# Patient Record
Sex: Female | Born: 1975 | Race: Black or African American | Hispanic: No | Marital: Married | Smoking: Never smoker
Health system: Southern US, Community
[De-identification: ages and names within clinical notes are randomized; demographics above are authoritative.]

## PROBLEM LIST (undated history)

## (undated) DIAGNOSIS — M419 Scoliosis, unspecified: Secondary | ICD-10-CM

## (undated) DIAGNOSIS — M549 Dorsalgia, unspecified: Secondary | ICD-10-CM

## (undated) DIAGNOSIS — G43909 Migraine, unspecified, not intractable, without status migrainosus: Secondary | ICD-10-CM

## (undated) HISTORY — PX: ENDOMETRIAL ABLATION: SHX621

## (undated) HISTORY — PX: BACK SURGERY: SHX140

## (undated) HISTORY — PX: ABDOMINAL HYSTERECTOMY: SHX81

## (undated) HISTORY — PX: TUBAL LIGATION: SHX77

## (undated) HISTORY — PX: CHOLECYSTECTOMY: SHX55

---

## 2000-03-09 ENCOUNTER — Ambulatory Visit (HOSPITAL_COMMUNITY): Admission: RE | Admit: 2000-03-09 | Discharge: 2000-03-09 | Payer: Self-pay | Admitting: Family Medicine

## 2000-03-09 ENCOUNTER — Encounter: Payer: Self-pay | Admitting: Family Medicine

## 2000-12-21 ENCOUNTER — Encounter: Admission: RE | Admit: 2000-12-21 | Discharge: 2000-12-21 | Payer: Self-pay

## 2013-07-06 ENCOUNTER — Emergency Department (HOSPITAL_BASED_OUTPATIENT_CLINIC_OR_DEPARTMENT_OTHER): Payer: BC Managed Care – PPO

## 2013-07-06 ENCOUNTER — Emergency Department (HOSPITAL_BASED_OUTPATIENT_CLINIC_OR_DEPARTMENT_OTHER)
Admission: EM | Admit: 2013-07-06 | Discharge: 2013-07-06 | Disposition: A | Discharge status: Home / Self Care | Payer: BC Managed Care – PPO | Attending: Emergency Medicine | Admitting: Emergency Medicine

## 2013-07-06 ENCOUNTER — Encounter (HOSPITAL_BASED_OUTPATIENT_CLINIC_OR_DEPARTMENT_OTHER): Payer: Self-pay | Admitting: Emergency Medicine

## 2013-07-06 DIAGNOSIS — R1032 Left lower quadrant pain: Secondary | ICD-10-CM | POA: Insufficient documentation

## 2013-07-06 DIAGNOSIS — Z8739 Personal history of other diseases of the musculoskeletal system and connective tissue: Secondary | ICD-10-CM | POA: Insufficient documentation

## 2013-07-06 DIAGNOSIS — Z79899 Other long term (current) drug therapy: Secondary | ICD-10-CM | POA: Insufficient documentation

## 2013-07-06 DIAGNOSIS — R110 Nausea: Secondary | ICD-10-CM | POA: Insufficient documentation

## 2013-07-06 DIAGNOSIS — R630 Anorexia: Secondary | ICD-10-CM | POA: Insufficient documentation

## 2013-07-06 DIAGNOSIS — Z3202 Encounter for pregnancy test, result negative: Secondary | ICD-10-CM | POA: Insufficient documentation

## 2013-07-06 DIAGNOSIS — Z8679 Personal history of other diseases of the circulatory system: Secondary | ICD-10-CM | POA: Insufficient documentation

## 2013-07-06 DIAGNOSIS — R197 Diarrhea, unspecified: Secondary | ICD-10-CM | POA: Insufficient documentation

## 2013-07-06 HISTORY — DX: Dorsalgia, unspecified: M54.9

## 2013-07-06 HISTORY — DX: Scoliosis, unspecified: M41.9

## 2013-07-06 HISTORY — DX: Migraine, unspecified, not intractable, without status migrainosus: G43.909

## 2013-07-06 LAB — URINE MICROSCOPIC-ADD ON

## 2013-07-06 LAB — COMPREHENSIVE METABOLIC PANEL
ALK PHOS: 69 U/L (ref 39–117)
ALT: 18 U/L (ref 0–35)
ANION GAP: 13 (ref 5–15)
AST: 24 U/L (ref 0–37)
Albumin: 4.2 g/dL (ref 3.5–5.2)
BUN: 7 mg/dL (ref 6–23)
CO2: 25 mEq/L (ref 19–32)
CREATININE: 0.8 mg/dL (ref 0.50–1.10)
Calcium: 9.5 mg/dL (ref 8.4–10.5)
Chloride: 105 mEq/L (ref 96–112)
GFR calc non Af Amer: >90 mL/min (ref >90)
GLUCOSE: 91 mg/dL (ref 70–99)
Potassium: 4.2 mEq/L (ref 3.7–5.3)
Sodium: 143 mEq/L (ref 137–147)
TOTAL PROTEIN: 7.5 g/dL (ref 6.0–8.3)
Total Bilirubin: 0.7 mg/dL (ref 0.3–1.2)

## 2013-07-06 LAB — PREGNANCY, URINE: Preg Test, Ur: NEGATIVE

## 2013-07-06 LAB — URINALYSIS, ROUTINE W REFLEX MICROSCOPIC
Bilirubin Urine: NEGATIVE
Glucose, UA: NEGATIVE mg/dL
Hgb urine dipstick: NEGATIVE
Ketones, ur: 15 mg/dL — AB
Nitrite: NEGATIVE
Protein, ur: NEGATIVE mg/dL
Specific Gravity, Urine: 1.024 (ref 1.005–1.030)
Urobilinogen, UA: 1 mg/dL (ref 0.0–1.0)
pH: 6.5 (ref 5.0–8.0)

## 2013-07-06 MED ORDER — HYDROCODONE-ACETAMINOPHEN 5-325 MG PO TABS: 1.0000 | ORAL_TABLET | Freq: Four times a day (QID) | ORAL | Status: DC | PRN

## 2013-07-06 MED ORDER — ONDANSETRON HCL 4 MG PO TABS: 4.0000 mg | ORAL_TABLET | Freq: Four times a day (QID) | ORAL | Status: DC

## 2013-07-06 MED ORDER — SODIUM CHLORIDE 0.9 % IV BOLUS (SEPSIS)
1000.0000 mL | Freq: Once | INTRAVENOUS | Status: AC
  Administered 2013-07-06: 1000 mL via INTRAVENOUS

## 2013-07-06 MED ORDER — MORPHINE SULFATE 4 MG/ML IJ SOLN
4.0000 mg | Freq: Once | INTRAMUSCULAR | Status: AC
  Administered 2013-07-06: 4 mg via INTRAVENOUS
  Filled 2013-07-06: qty 1

## 2013-07-06 MED ORDER — IOHEXOL 300 MG/ML  SOLN
100.0000 mL | Freq: Once | INTRAMUSCULAR | Status: AC | PRN
  Administered 2013-07-06: 100 mL via INTRAVENOUS

## 2013-07-06 MED ORDER — IOHEXOL 300 MG/ML  SOLN
50.0000 mL | Freq: Once | INTRAMUSCULAR | Status: AC | PRN
  Administered 2013-07-06: 50 mL via ORAL

## 2013-07-06 MED ORDER — ONDANSETRON HCL 4 MG/2ML IJ SOLN
4.0000 mg | Freq: Once | INTRAMUSCULAR | Status: AC
  Administered 2013-07-06: 4 mg via INTRAVENOUS
  Filled 2013-07-06: qty 2

## 2013-07-06 MED ORDER — LOPERAMIDE HCL 2 MG PO CAPS
4.0000 mg | ORAL_CAPSULE | Freq: Once | ORAL | Status: AC
  Administered 2013-07-06: 4 mg via ORAL
  Filled 2013-07-06: qty 2

## 2013-07-06 NOTE — ED Provider Notes (Signed)
CSN: [id number]     Arrival date & time 07/06/13  1129 History   First MD Initiated Contact with Patient 07/06/13 1144     Chief Complaint  Patient presents with  . Abdominal Pain     (Consider location/radiation/quality/duration/timing/severity/associated sxs/prior Treatment) Patient is a 38 y.o. female presenting with abdominal pain. The history is provided by the patient.  Abdominal Pain Pain location:  LLQ Pain quality: sharp, shooting, stabbing and throbbing   Pain radiates to:  Does not radiate Pain severity:  Moderate Onset quality:  Gradual Duration:  3 days Timing:  Constant Progression:  Worsening Chronicity:  Recurrent (pt states for months she will have 1 days of abd pain and diarrhea but always resolves on its own within 12-24 hours until now) Context: eating   Context: not alcohol use, not previous surgeries, not recent travel, not sick contacts, not suspicious food intake and not trauma   Relieved by:  Nothing Worsened by:  Eating Ineffective treatments:  None tried Associated symptoms: anorexia, diarrhea and nausea   Associated symptoms: no chills, no constipation, no dysuria, no fever, no hematuria, no vaginal bleeding and no vomiting   Risk factors: has not had multiple surgeries, not pregnant and no recent hospitalization     Past Medical History  Diagnosis Date  . Back pain   . Scoliosis   . Migraine    No past surgical history on file. No family history on file. History  Substance Use Topics  . Smoking status: Never Smoker   . Smokeless tobacco: Not on file  . Alcohol Use: No   OB History   Grav Para Term Preterm Abortions TAB SAB Ect Mult Living                 Review of Systems  Constitutional: Negative for fever and chills.  Gastrointestinal: Positive for nausea, abdominal pain, diarrhea and anorexia. Negative for vomiting and constipation.  Genitourinary: Negative for dysuria, hematuria and vaginal bleeding.  All other systems reviewed and  are negative.     Allergies  Biaxin  Home Medications   Prior to Admission medications   Medication Sig Start Date End Date Taking? Authorizing Provider  carisoprodol (SOMA) 350 MG tablet Take 350 mg by mouth 4 (four) times daily as needed for muscle spasms.   Yes Historical Provider, MD  ibuprofen (ADVIL,MOTRIN) 800 MG tablet Take 800 mg by mouth every 8 (eight) hours as needed.   Yes Historical Provider, MD  Ondansetron HCl (ZOFRAN PO) Take by mouth.   Yes Historical Provider, MD   BP 156/94  Pulse 76  Temp(Src) 98.3 F (36.8 C) (Oral)  Resp 20  Ht 5\' 4"  (1.626 m)  Wt 142 lb (64.411 kg)  BMI 24.36 kg/m2  SpO2 100% Physical Exam  Nursing note and vitals reviewed. Constitutional: She is oriented to person, place, and time. She appears well-developed and well-nourished. No distress.  HENT:  Head: Normocephalic and atraumatic.  Eyes: EOM are normal. Pupils are equal, round, and reactive to light.  Cardiovascular: Normal rate, regular rhythm, normal heart sounds and intact distal pulses.  Exam reveals no friction rub.   No murmur heard. Pulmonary/Chest: Effort normal and breath sounds normal. She has no wheezes. She has no rales.  Abdominal: Soft. Bowel sounds are normal. She exhibits no distension. There is tenderness in the left lower quadrant. There is guarding. There is no rebound and no CVA tenderness.  Musculoskeletal: Normal range of motion. She exhibits no tenderness.  No edema  Neurological: She is alert and oriented to person, place, and time. No cranial nerve deficit.  Skin: Skin is warm and dry. No rash noted.  Psychiatric: She has a normal mood and affect. Her behavior is normal.    ED Course  Procedures (including critical care time) Labs Review Labs Reviewed  URINALYSIS, ROUTINE W REFLEX MICROSCOPIC - Abnormal; Notable for the following:    Ketones, ur 15 (*)    Leukocytes, UA TRACE (*)    All other components within normal limits  URINE MICROSCOPIC-ADD  ON - Abnormal; Notable for the following:    Squamous Epithelial / LPF FEW (*)    Bacteria, UA MANY (*)    All other components within normal limits  PREGNANCY, URINE  COMPREHENSIVE METABOLIC PANEL    Imaging Review Ct Abdomen Pelvis W Contrast  07/06/2013   CLINICAL DATA:  Abdominal pain and diarrhea  EXAM: CT ABDOMEN AND PELVIS WITH CONTRAST  TECHNIQUE: Multidetector CT imaging of the abdomen and pelvis was performed using the standard protocol following bolus administration of intravenous contrast.  CONTRAST:  100mL OMNIPAQUE IOHEXOL 300 MG/ML  SOLN  COMPARISON:  None.  FINDINGS: The lung bases are free of acute infiltrate or sizable effusion.  The liver, spleen, adrenal glands and pancreas are within normal limits. The gallbladder is partially distended and demonstrates a single calcified gallstone within. The kidneys are somewhat limited evaluation due to scatter artifact from spinal fixation rods. No acute abnormality is noted diffuse spinal fixation rods are noted in the lower thoracic and upper lumbar spine.  The appendix is air-filled and within normal limits. The bladder is partially distended. No pelvic mass lesion is seen. Mild pelvic varices are noted on the left. This could be related to the patient's underlying clinical history of left lower quadrant pain.  IMPRESSION: Cholelithiasis.  Normal-appearing appendix.  Mild left pelvic varices.  Chronic changes in the spine.   Electronically Signed   By: Alcide CleverMark  Lukens M.D.   On: 07/06/2013 13:32     EKG Interpretation None      MDM   Final diagnoses:  Left lower quadrant pain  Diarrhea    Pt with intermittent abdominal pain and diarrhea for months but usually only last one day presents today with left lower quadrant abdominal pain and diarrhea for the last 3 days which is worsening. Denies any vaginal symptoms and no history of ovarian cysts. Denies any urinary symptoms. Pain is worse with eating. No recent travel, bad food exposure  or antibiotic use.  Pt seen at PCP's office prior to coming with normal CBC.   Pt has guarding and significant tenderness in LLQ and strong family hx of colon cancer.  Pt can't relate sx to certain foods.  UA and UPT within normal limits. CMP pending. CT of abdomen and pelvis pending. Patient given IV fluids, nausea and pain medication.  2:10 PM Labs and CT neg for acute issues.  Low suspicion for ovarian or kidney pathology.  Pt has follow up with GI through her pCP.  Gwyneth SproutWhitney Avah Bashor, MD 07/06/13 [phone]

## 2013-07-06 NOTE — ED Notes (Signed)
Abdominal pain and diarrhea x 3 days. Hx of similar episodes but she has not been diagnosed with a cause.

## 2013-07-06 NOTE — Discharge Instructions (Signed)
Abdominal Pain, Women °Abdominal (stomach, pelvic, or belly) pain can be caused by many things. It is important to tell your doctor: °· The location of the pain. °· Does it come and go or is it present all the time? °· Are there things that start the pain (eating certain foods, exercise)? °· Are there other symptoms associated with the pain (fever, nausea, vomiting, diarrhea)? °All of this is helpful to know when trying to find the cause of the pain. °CAUSES  °· Stomach: virus or bacteria infection, or ulcer. °· Intestine: appendicitis (inflamed appendix), regional ileitis (Crohn's disease), ulcerative colitis (inflamed colon), irritable bowel syndrome, diverticulitis (inflamed diverticulum of the colon), or cancer of the stomach or intestine. °· Gallbladder disease or stones in the gallbladder. °· Kidney disease, kidney stones, or infection. °· Pancreas infection or cancer. °· Fibromyalgia (pain disorder). °· Diseases of the female organs: °¨ Uterus: fibroid (non-cancerous) tumors or infection. °¨ Fallopian tubes: infection or tubal pregnancy. °¨ Ovary: cysts or tumors. °¨ Pelvic adhesions (scar tissue). °¨ Endometriosis (uterus lining tissue growing in the pelvis and on the pelvic organs). °¨ Pelvic congestion syndrome (female organs filling up with blood just before the menstrual period). °¨ Pain with the menstrual period. °¨ Pain with ovulation (producing an egg). °¨ Pain with an IUD (intrauterine device, birth control) in the uterus. °¨ Cancer of the female organs. °· Functional pain (pain not caused by a disease, may improve without treatment). °· Psychological pain. °· Depression. °DIAGNOSIS  °Your doctor will decide the seriousness of your pain by doing an examination. °· Blood tests. °· X-rays. °· Ultrasound. °· CT scan (computed tomography, special type of X-ray). °· MRI (magnetic resonance imaging). °· Cultures, for infection. °· Barium enema (dye inserted in the large intestine, to better view it with  X-rays). °· Colonoscopy (looking in intestine with a lighted tube). °· Laparoscopy (minor surgery, looking in abdomen with a lighted tube). °· Major abdominal exploratory surgery (looking in abdomen with a large incision). °TREATMENT  °The treatment will depend on the cause of the pain.  °· Many cases can be observed and treated at home. °· Over-the-counter medicines recommended by your caregiver. °· Prescription medicine. °· Antibiotics, for infection. °· Birth control pills, for painful periods or for ovulation pain. °· Hormone treatment, for endometriosis. °· Nerve blocking injections. °· Physical therapy. °· Antidepressants. °· Counseling with a psychologist or psychiatrist. °· Minor or major surgery. °HOME CARE INSTRUCTIONS  °· Do not take laxatives, unless directed by your caregiver. °· Take over-the-counter pain medicine only if ordered by your caregiver. Do not take aspirin because it can cause an upset stomach or bleeding. °· Try a clear liquid diet (broth or water) as ordered by your caregiver. Slowly move to a bland diet, as tolerated, if the pain is related to the stomach or intestine. °· Have a thermometer and take your temperature several times a day, and record it. °· Bed rest and sleep, if it helps the pain. °· Avoid sexual intercourse, if it causes pain. °· Avoid stressful situations. °· Keep your follow-up appointments and tests, as your caregiver orders. °· If the pain does not go away with medicine or surgery, you may try: °¨ Acupuncture. °¨ Relaxation exercises (yoga, meditation). °¨ Group therapy. °¨ Counseling. °SEEK MEDICAL CARE IF:  °· You notice certain foods cause stomach pain. °· Your home care treatment is not helping your pain. °· You need stronger pain medicine. °· You want your IUD removed. °· You feel faint or   lightheaded. °· You develop nausea and vomiting. °· You develop a rash. °· You are having side effects or an allergy to your medicine. °SEEK IMMEDIATE MEDICAL CARE IF:  °· Your  pain does not go away or gets worse. °· You have a fever. °· Your pain is felt only in portions of the abdomen. The right side could possibly be appendicitis. The left lower portion of the abdomen could be colitis or diverticulitis. °· You are passing blood in your stools (bright red or black tarry stools, with or without vomiting). °· You have blood in your urine. °· You develop chills, with or without a fever. °· You pass out. °MAKE SURE YOU:  °· Understand these instructions. °· Will watch your condition. °· Will get help right away if you are not doing well or get worse. °Document Released: 10/18/2006 Document Revised: 03/15/2011 Document Reviewed: 11/07/2008 °[employer]® Patient Information ©2015 [employer]. This information is not intended to replace advice given to you by your health care provider. Make sure you discuss any questions you have with your health care provider. ° °

## 2015-07-17 IMAGING — CT CT ABD-PELV W/ CM
2 of 4 series · 17 of 46 positions shown, 19 images · IV contrast (APPLIED)
Comparison: None.

CLINICAL DATA: Abdominal pain and diarrhea

EXAM:
CT ABDOMEN AND PELVIS WITH CONTRAST
TECHNIQUE: Multidetector CT imaging of the abdomen and pelvis was performed
using the standard protocol following bolus administration of
intravenous contrast.
CONTRAST:  100mL OMNIPAQUE IOHEXOL 300 MG/ML  SOLN

[Series 3: abd/pelvis 5.0 b31f · axial · 0.74mm/px · z∈[-372,+8]mm · 14 of 84 slices shown, 16 images]
[im 4/84  soft-tissue]
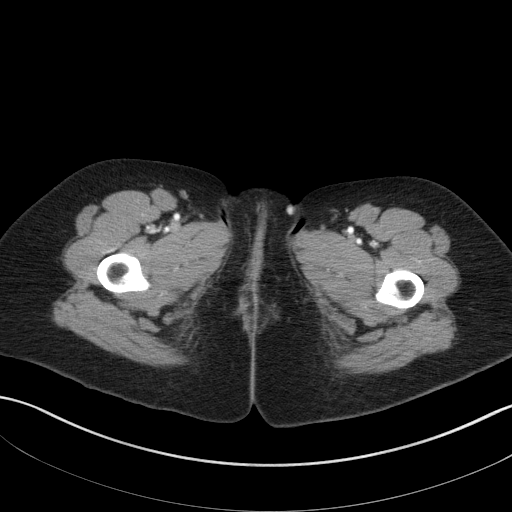
[im 4/84  bone]
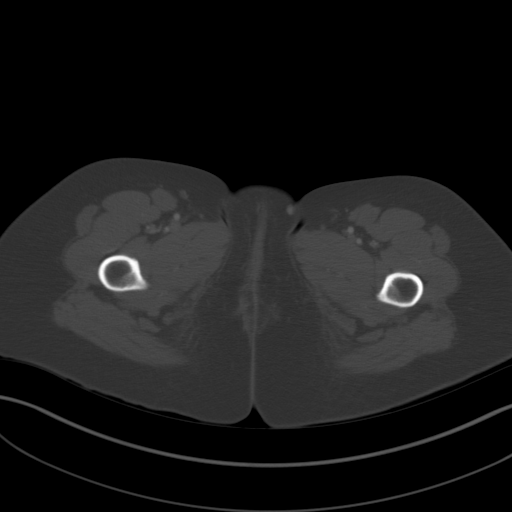
[im 10/84  soft-tissue]
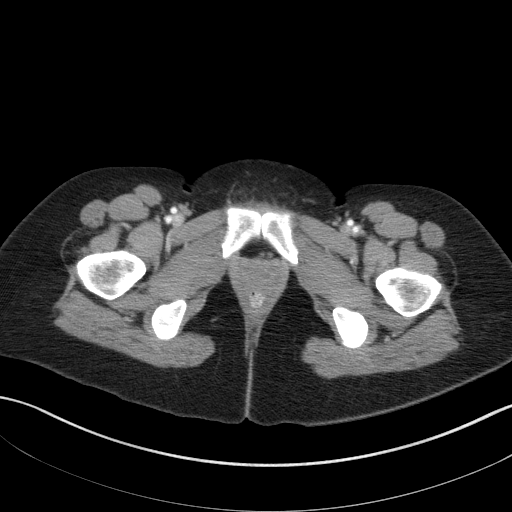
[im 17/84  soft-tissue]
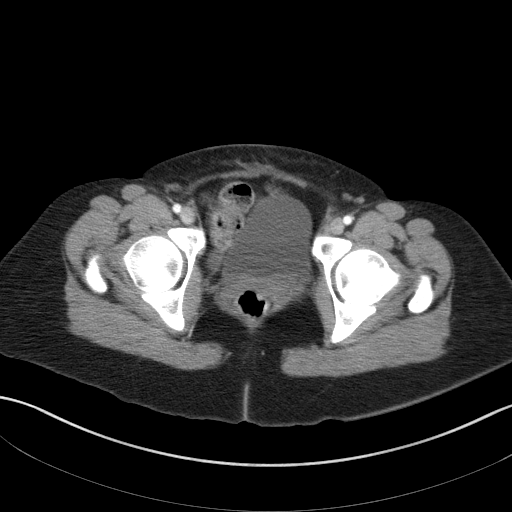
[im 24/84  soft-tissue]
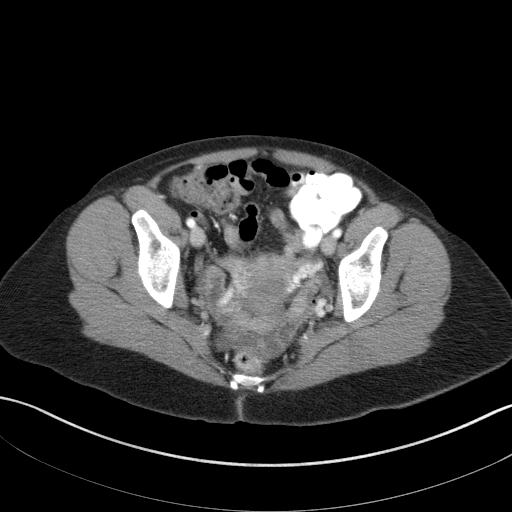
[im 27/84  soft-tissue]
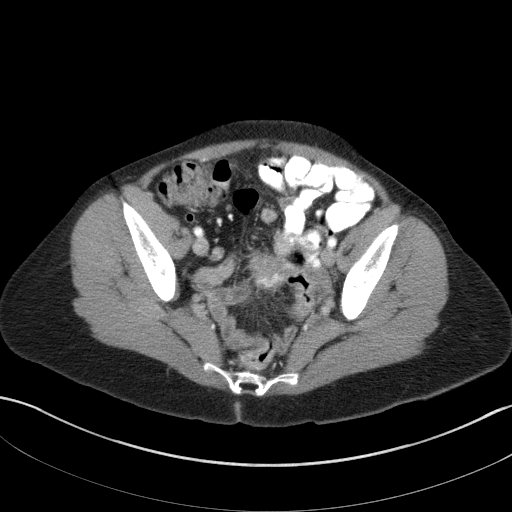
[im 34/84  soft-tissue]
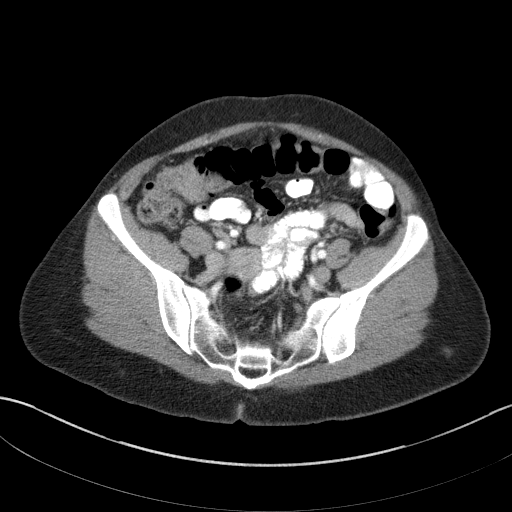
[im 40/84  soft-tissue]
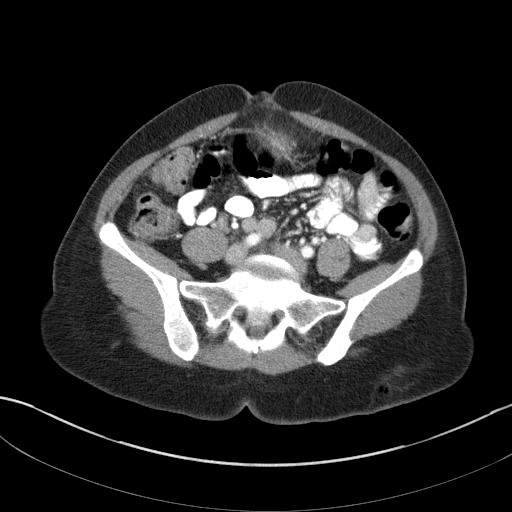
[im 44/84  soft-tissue]
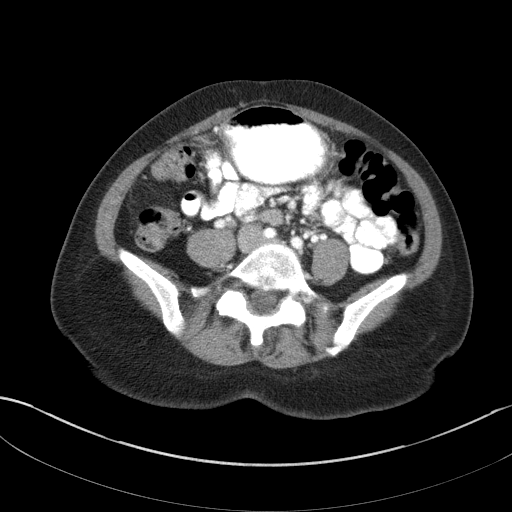
[im 50/84  soft-tissue]
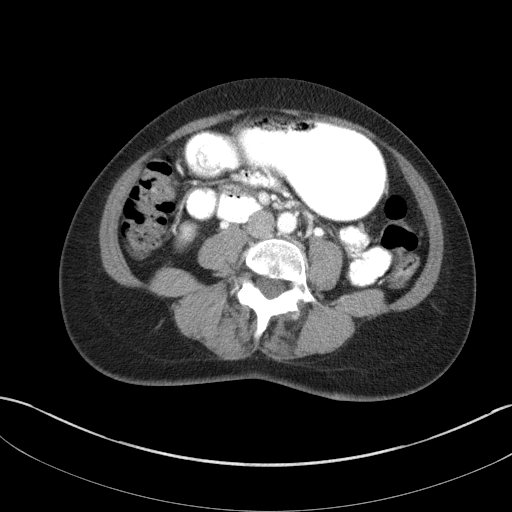
[im 50/84  bone]
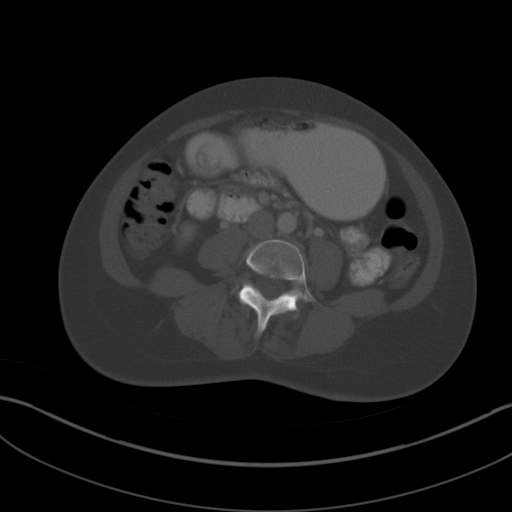
[im 57/84  soft-tissue]
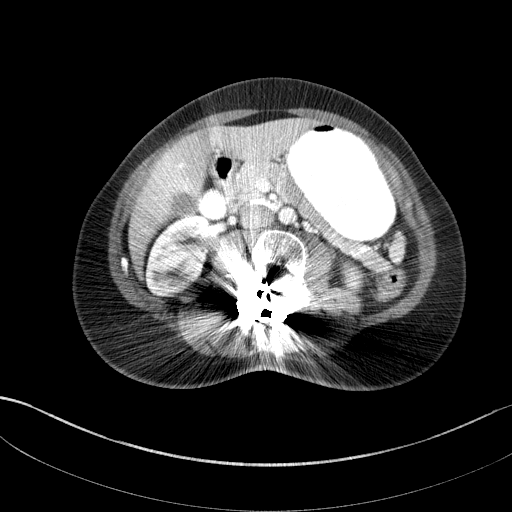
[im 64/84  soft-tissue]
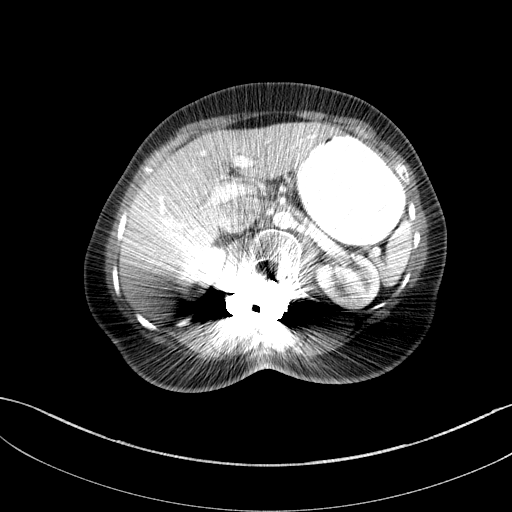
[im 67/84  soft-tissue]
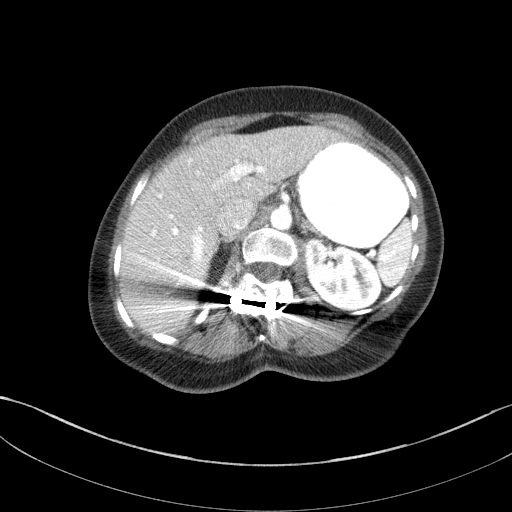
[im 74/84  soft-tissue]
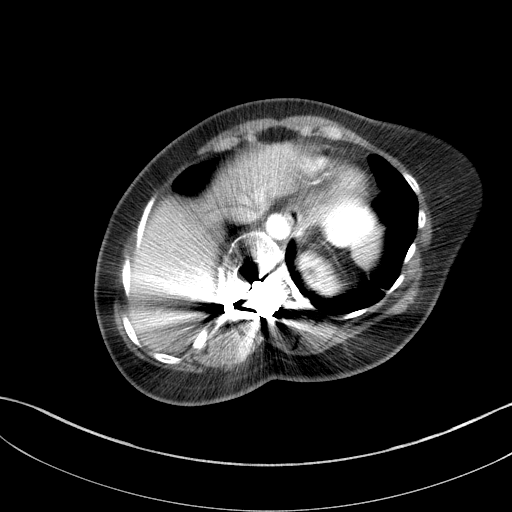
[im 80/84  soft-tissue]
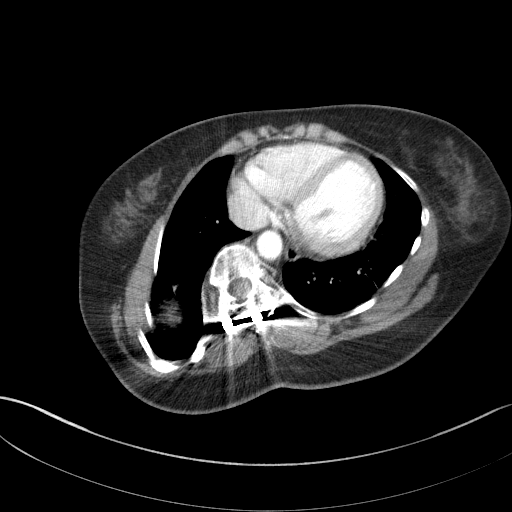

[Series 6: abd/pelvis 3.0 coronal · coronal · 0.89mm/px · 3 of 86 slices shown]
[im 29/86  soft-tissue]
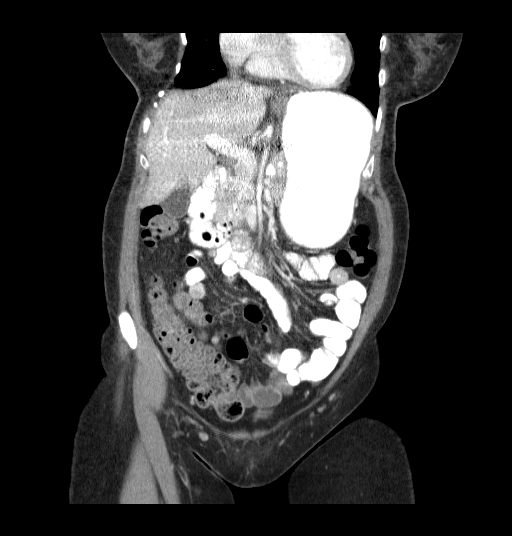
[im 38/86  soft-tissue]
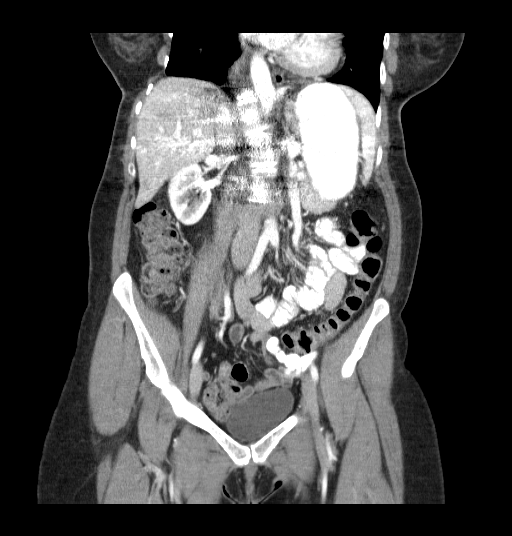
[im 48/86  soft-tissue]
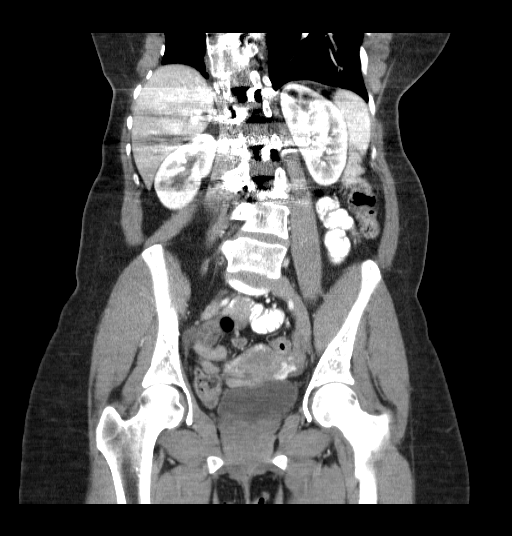

[17 of 46 positions shown; findings below may reference images not displayed]

FINDINGS: The lung bases are free of acute infiltrate or sizable effusion.

The liver, spleen, adrenal glands and pancreas are within normal
limits. The gallbladder is partially distended and demonstrates a
single calcified gallstone within. The kidneys are somewhat limited
evaluation due to scatter artifact from spinal fixation rods. No
acute abnormality is noted diffuse spinal fixation rods are noted in
the lower thoracic and upper lumbar spine.

The appendix is air-filled and within normal limits. The bladder is
partially distended. No pelvic mass lesion is seen. Mild pelvic
varices are noted on the left. This could be related to the
patient's underlying clinical history of left lower quadrant pain.
IMPRESSION: Cholelithiasis.

Normal-appearing appendix.

Mild left pelvic varices.

Chronic changes in the spine.

## 2020-03-15 ENCOUNTER — Emergency Department (HOSPITAL_BASED_OUTPATIENT_CLINIC_OR_DEPARTMENT_OTHER)
Admission: EM | Admit: 2020-03-15 | Discharge: 2020-03-15 | Disposition: A | Discharge status: Home / Self Care | Payer: BC Managed Care – PPO | Attending: Emergency Medicine | Admitting: Emergency Medicine

## 2020-03-15 ENCOUNTER — Encounter (HOSPITAL_BASED_OUTPATIENT_CLINIC_OR_DEPARTMENT_OTHER): Payer: Self-pay | Admitting: *Deleted

## 2020-03-15 ENCOUNTER — Other Ambulatory Visit: Payer: Self-pay

## 2020-03-15 DIAGNOSIS — R519 Headache, unspecified: Secondary | ICD-10-CM | POA: Diagnosis not present

## 2020-03-15 DIAGNOSIS — M25511 Pain in right shoulder: Secondary | ICD-10-CM | POA: Diagnosis present

## 2020-03-15 DIAGNOSIS — Y92481 Parking lot as the place of occurrence of the external cause: Secondary | ICD-10-CM | POA: Diagnosis not present

## 2020-03-15 DIAGNOSIS — R110 Nausea: Secondary | ICD-10-CM | POA: Diagnosis not present

## 2020-03-15 DIAGNOSIS — R0789 Other chest pain: Secondary | ICD-10-CM | POA: Insufficient documentation
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
## 2020-03-15 NOTE — Discharge Instructions (Signed)
Please come back and see [place] if your symptoms change or worsen.   Please take Tylenol/Ibuprofen together as needed 3 times daily for body aches, shoulder pain.   Please be sure to follow up with your primary care provider within the next week or 2 as needed.
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
## 2020-03-25 ENCOUNTER — Other Ambulatory Visit: Payer: Self-pay

## 2020-03-25 ENCOUNTER — Encounter (HOSPITAL_BASED_OUTPATIENT_CLINIC_OR_DEPARTMENT_OTHER): Payer: Self-pay | Admitting: *Deleted

## 2020-03-25 ENCOUNTER — Emergency Department (HOSPITAL_BASED_OUTPATIENT_CLINIC_OR_DEPARTMENT_OTHER): Payer: BC Managed Care – PPO

## 2020-03-25 ENCOUNTER — Emergency Department (HOSPITAL_BASED_OUTPATIENT_CLINIC_OR_DEPARTMENT_OTHER)
Admission: EM | Admit: 2020-03-25 | Discharge: 2020-03-25 | Disposition: A | Discharge status: Home / Self Care | Payer: BC Managed Care – PPO | Attending: Emergency Medicine | Admitting: Emergency Medicine

## 2020-03-25 DIAGNOSIS — E041 Nontoxic single thyroid nodule: Secondary | ICD-10-CM | POA: Diagnosis not present

## 2020-03-25 DIAGNOSIS — R519 Headache, unspecified: Secondary | ICD-10-CM | POA: Diagnosis not present

## 2020-03-25 DIAGNOSIS — M542 Cervicalgia: Secondary | ICD-10-CM | POA: Diagnosis present

## 2020-03-25 DIAGNOSIS — R110 Nausea: Secondary | ICD-10-CM | POA: Diagnosis not present

## 2020-03-25 DIAGNOSIS — H538 Other visual disturbances: Secondary | ICD-10-CM | POA: Insufficient documentation

## 2020-03-25 MED ORDER — METOCLOPRAMIDE HCL 5 MG/ML IJ SOLN
10.0000 mg | Freq: Once | INTRAMUSCULAR | Status: AC
  Administered 2020-03-25: 10 mg via INTRAVENOUS
  Filled 2020-03-25: qty 2

## 2020-03-25 MED ORDER — SODIUM CHLORIDE 0.9 % IV BOLUS
1000.0000 mL | Freq: Once | INTRAVENOUS | Status: AC
  Administered 2020-03-25: 1000 mL via INTRAVENOUS

## 2020-03-25 MED ORDER — DIPHENHYDRAMINE HCL 50 MG/ML IJ SOLN
25.0000 mg | Freq: Once | INTRAMUSCULAR | Status: AC
  Administered 2020-03-25: 25 mg via INTRAVENOUS
  Filled 2020-03-25: qty 1

## 2020-03-25 NOTE — ED Notes (Signed)
Pt. Reports she was seen on Sat. At [facility] for this same headache.  Pt. Today reports she has a frontal headache and neck ache.  Pt. Reports she has had x rays and still has a headache.  Pt. Reports she only does not have a headache unless she is asleep.
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
## 2020-03-25 NOTE — ED Notes (Signed)
Pt. Reports she has had a hysterectomy and no chance of pregnancy
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
## 2020-03-26 ENCOUNTER — Telehealth: Payer: Self-pay | Admitting: Family Medicine

## 2020-03-26 NOTE — Telephone Encounter (Signed)
Patient called requesting to schedule an appointment with [place] for the Concussion [facility] per the recommendation of the ED.  Please advise.

## 2020-03-27 NOTE — Telephone Encounter (Signed)
Called pt and scheduled her for initial concussion eval next Wed, March 30th at 8:30 am.
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
## 2020-04-02 ENCOUNTER — Other Ambulatory Visit: Payer: Self-pay

## 2020-04-02 ENCOUNTER — Ambulatory Visit: Payer: BC Managed Care – PPO | Admitting: Family Medicine

## 2020-04-02 ENCOUNTER — Encounter: Payer: Self-pay | Admitting: Family Medicine

## 2020-04-02 VITALS — BP 138/90 | HR 75 | Ht 64.0 in | Wt 140.0 lb

## 2020-04-02 DIAGNOSIS — M542 Cervicalgia: Secondary | ICD-10-CM | POA: Diagnosis not present

## 2020-04-02 DIAGNOSIS — M25512 Pain in left shoulder: Secondary | ICD-10-CM

## 2020-04-02 DIAGNOSIS — S060X0A Concussion without loss of consciousness, initial encounter: Secondary | ICD-10-CM

## 2020-04-02 MED ORDER — NORTRIPTYLINE HCL 25 MG PO CAPS: 25.0000 mg | ORAL_CAPSULE | Freq: Every day | ORAL | 2 refills | Status: DC

## 2020-04-02 NOTE — Patient Instructions (Addendum)
Thank you for coming in today.  Try nortriptyline at bedtime.   I've referred you to Physical Therapy.  Let us know if you don't hear from them in one week.  Recheck in about 2 weeks.   TENS UNIT: This is helpful for muscle pain and spasm.   Search and Purchase a TENS 7000 2nd edition at  www.tenspros.com or www.Amazon.com It should be less than $30.     TENS unit instructions: Do not shower or bathe with the unit on . Turn the unit off before removing electrodes or batteries . If the electrodes lose stickiness add a drop of water to the electrodes after they are disconnected from the unit and place on plastic sheet. If you continued to have difficulty, call the TENS unit company to purchase more electrodes. . Do not apply lotion on the skin area prior to use. Make sure the skin is clean and dry as this will help prolong the life of the electrodes. . After use, always check skin for unusual red areas, rash or other skin difficulties. If there are any skin problems, does not apply electrodes to the same area. . Never remove the electrodes from the unit by pulling the wires. . Do not use the TENS unit or electrodes other than as directed. . Do not change electrode placement without consultating your therapist or physician. Marland Kitchen Keep 2 fingers with between each electrode. . Wear time ratio is 2:1, on to off times.    For example on for 30 minutes off for 15 minutes and then on for 30 minutes off for 15 minutes

## 2020-04-09 ENCOUNTER — Other Ambulatory Visit: Payer: Self-pay

## 2020-04-09 ENCOUNTER — Ambulatory Visit: Payer: BC Managed Care – PPO | Attending: Family Medicine | Admitting: Physical Therapy

## 2020-04-09 DIAGNOSIS — M542 Cervicalgia: Secondary | ICD-10-CM | POA: Diagnosis present

## 2020-04-09 DIAGNOSIS — R6889 Other general symptoms and signs: Secondary | ICD-10-CM | POA: Diagnosis present

## 2020-04-09 DIAGNOSIS — M25512 Pain in left shoulder: Secondary | ICD-10-CM | POA: Diagnosis present

## 2020-04-09 DIAGNOSIS — M6281 Muscle weakness (generalized): Secondary | ICD-10-CM | POA: Diagnosis present

## 2020-04-09 NOTE — Patient Instructions (Signed)
Access Code: [email or web address] URL: [email or web address] Date: 04/09/2020 Prepared by: Reggy Eye  Exercises Seated Scapular Retraction - 1 x daily - 7 x weekly - 3 sets - 10 reps Shoulder External Rotation and Scapular Retraction with Resistance - 1 x daily - 7 x weekly - 3 sets - 10 reps Seated Cervical Rotation AROM - 1 x daily - 7 x weekly - 1 sets - 10 reps - 3-5 seconds hold
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
## 2020-04-16 ENCOUNTER — Other Ambulatory Visit: Payer: Self-pay

## 2020-04-16 ENCOUNTER — Ambulatory Visit: Payer: BC Managed Care – PPO | Admitting: Family Medicine

## 2020-04-16 VITALS — BP 129/91 | HR 79 | Ht 64.0 in | Wt 139.0 lb

## 2020-04-16 DIAGNOSIS — S060X0D Concussion without loss of consciousness, subsequent encounter: Secondary | ICD-10-CM

## 2020-04-16 DIAGNOSIS — R42 Dizziness and giddiness: Secondary | ICD-10-CM

## 2020-04-16 NOTE — Patient Instructions (Signed)
Thank you for coming in today.  Continue limited PT.   Continue nortriptyline as needed. Eventually it will make sense to stop.   Recheck with me as needed.   Communicate as needed as well.  It is ok to ask for help.

## 2020-04-22 ENCOUNTER — Encounter: Payer: Self-pay | Admitting: Physical Therapy

## 2020-04-22 ENCOUNTER — Ambulatory Visit: Payer: BC Managed Care – PPO | Admitting: Physical Therapy

## 2020-04-22 ENCOUNTER — Other Ambulatory Visit: Payer: Self-pay

## 2020-04-22 DIAGNOSIS — R6889 Other general symptoms and signs: Secondary | ICD-10-CM

## 2020-04-22 DIAGNOSIS — M6281 Muscle weakness (generalized): Secondary | ICD-10-CM

## 2020-04-22 DIAGNOSIS — M25512 Pain in left shoulder: Secondary | ICD-10-CM | POA: Diagnosis not present

## 2020-04-22 DIAGNOSIS — M542 Cervicalgia: Secondary | ICD-10-CM
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
## 2020-04-23 ENCOUNTER — Ambulatory Visit: Payer: BC Managed Care – PPO | Admitting: Physical Therapy

## 2020-04-23 DIAGNOSIS — M25512 Pain in left shoulder: Secondary | ICD-10-CM | POA: Diagnosis not present

## 2020-04-23 DIAGNOSIS — M6281 Muscle weakness (generalized): Secondary | ICD-10-CM

## 2020-04-23 DIAGNOSIS — M542 Cervicalgia: Secondary | ICD-10-CM

## 2020-04-23 DIAGNOSIS — R6889 Other general symptoms and signs: Secondary | ICD-10-CM
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
## 2020-04-24 ENCOUNTER — Other Ambulatory Visit: Payer: Self-pay | Admitting: Family Medicine

## 2020-04-24 NOTE — Telephone Encounter (Signed)
Rx refill request approved per Dr. Corey's orders. 

## 2020-04-30 ENCOUNTER — Ambulatory Visit: Payer: BC Managed Care – PPO | Admitting: Physical Therapy

## 2020-04-30 ENCOUNTER — Other Ambulatory Visit: Payer: Self-pay

## 2020-04-30 DIAGNOSIS — M6281 Muscle weakness (generalized): Secondary | ICD-10-CM

## 2020-04-30 DIAGNOSIS — R6889 Other general symptoms and signs: Secondary | ICD-10-CM

## 2020-04-30 DIAGNOSIS — M25512 Pain in left shoulder: Secondary | ICD-10-CM

## 2020-04-30 DIAGNOSIS — M542 Cervicalgia: Secondary | ICD-10-CM
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
## 2020-05-02 ENCOUNTER — Other Ambulatory Visit: Payer: Self-pay

## 2020-05-02 ENCOUNTER — Ambulatory Visit: Payer: BC Managed Care – PPO | Admitting: Physical Therapy

## 2020-05-02 DIAGNOSIS — M25512 Pain in left shoulder: Secondary | ICD-10-CM | POA: Diagnosis not present

## 2020-05-02 DIAGNOSIS — R6889 Other general symptoms and signs: Secondary | ICD-10-CM

## 2020-05-02 DIAGNOSIS — M542 Cervicalgia: Secondary | ICD-10-CM

## 2020-05-02 DIAGNOSIS — M6281 Muscle weakness (generalized): Secondary | ICD-10-CM
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
## 2020-05-06 ENCOUNTER — Ambulatory Visit: Payer: BC Managed Care – PPO | Attending: Family Medicine | Admitting: Rehabilitative and Restorative Service Providers"

## 2020-05-06 ENCOUNTER — Encounter: Payer: Self-pay | Admitting: Rehabilitative and Restorative Service Providers"

## 2020-05-06 ENCOUNTER — Other Ambulatory Visit: Payer: Self-pay

## 2020-05-06 DIAGNOSIS — M25512 Pain in left shoulder: Secondary | ICD-10-CM | POA: Diagnosis present

## 2020-05-06 DIAGNOSIS — M542 Cervicalgia: Secondary | ICD-10-CM | POA: Insufficient documentation

## 2020-05-06 DIAGNOSIS — R6889 Other general symptoms and signs: Secondary | ICD-10-CM | POA: Insufficient documentation

## 2020-05-06 DIAGNOSIS — M6281 Muscle weakness (generalized): Secondary | ICD-10-CM | POA: Diagnosis present
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
## 2020-05-09 ENCOUNTER — Other Ambulatory Visit: Payer: Self-pay

## 2020-05-09 ENCOUNTER — Ambulatory Visit: Payer: BC Managed Care – PPO | Admitting: Physical Therapy

## 2020-05-09 ENCOUNTER — Encounter: Payer: Self-pay | Admitting: Physical Therapy

## 2020-05-09 DIAGNOSIS — M542 Cervicalgia: Secondary | ICD-10-CM | POA: Diagnosis not present

## 2020-05-09 DIAGNOSIS — M25512 Pain in left shoulder: Secondary | ICD-10-CM

## 2020-05-09 DIAGNOSIS — M6281 Muscle weakness (generalized): Secondary | ICD-10-CM

## 2020-05-09 DIAGNOSIS — R6889 Other general symptoms and signs: Secondary | ICD-10-CM
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
## 2020-05-13 ENCOUNTER — Encounter: Payer: Self-pay | Admitting: Physical Therapy

## 2020-05-13 ENCOUNTER — Ambulatory Visit: Payer: BC Managed Care – PPO | Admitting: Physical Therapy

## 2020-05-13 ENCOUNTER — Other Ambulatory Visit: Payer: Self-pay

## 2020-05-13 DIAGNOSIS — M542 Cervicalgia: Secondary | ICD-10-CM

## 2020-05-13 DIAGNOSIS — R6889 Other general symptoms and signs: Secondary | ICD-10-CM

## 2020-05-13 DIAGNOSIS — M6281 Muscle weakness (generalized): Secondary | ICD-10-CM

## 2020-05-13 DIAGNOSIS — M25512 Pain in left shoulder: Secondary | ICD-10-CM
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
## 2020-05-13 NOTE — Patient Instructions (Signed)

## 2020-05-16 ENCOUNTER — Other Ambulatory Visit: Payer: Self-pay

## 2020-05-16 ENCOUNTER — Ambulatory Visit: Payer: BC Managed Care – PPO | Admitting: Physical Therapy

## 2020-05-16 ENCOUNTER — Encounter: Payer: Self-pay | Admitting: Physical Therapy

## 2020-05-16 DIAGNOSIS — M25512 Pain in left shoulder: Secondary | ICD-10-CM

## 2020-05-16 DIAGNOSIS — M6281 Muscle weakness (generalized): Secondary | ICD-10-CM

## 2020-05-16 DIAGNOSIS — M542 Cervicalgia: Secondary | ICD-10-CM

## 2020-05-16 DIAGNOSIS — R6889 Other general symptoms and signs: Secondary | ICD-10-CM
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
## 2020-05-28 ENCOUNTER — Ambulatory Visit: Payer: BC Managed Care – PPO | Admitting: Rehabilitative and Restorative Service Providers"

## 2020-05-28 ENCOUNTER — Encounter: Payer: Self-pay | Admitting: Rehabilitative and Restorative Service Providers"

## 2020-05-28 ENCOUNTER — Other Ambulatory Visit: Payer: Self-pay

## 2020-05-28 DIAGNOSIS — R6889 Other general symptoms and signs: Secondary | ICD-10-CM

## 2020-05-28 DIAGNOSIS — M25512 Pain in left shoulder: Secondary | ICD-10-CM

## 2020-05-28 DIAGNOSIS — M542 Cervicalgia: Secondary | ICD-10-CM

## 2020-05-28 DIAGNOSIS — M6281 Muscle weakness (generalized): Secondary | ICD-10-CM
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
